# Patient Record
Sex: Male | Born: 1980 | Race: White | Hispanic: No | Marital: Married | Smoking: Never smoker
Health system: Southern US, Community
[De-identification: ages and names within clinical notes are randomized; demographics above are authoritative.]

## PROBLEM LIST (undated history)

## (undated) DIAGNOSIS — Z9889 Other specified postprocedural states: Secondary | ICD-10-CM

## (undated) DIAGNOSIS — I8290 Acute embolism and thrombosis of unspecified vein: Secondary | ICD-10-CM

## (undated) DIAGNOSIS — R112 Nausea with vomiting, unspecified: Secondary | ICD-10-CM

## (undated) HISTORY — PX: OTHER SURGICAL HISTORY: SHX169

---

## 1999-12-06 ENCOUNTER — Encounter: Payer: Self-pay | Admitting: Specialist

## 1999-12-06 ENCOUNTER — Ambulatory Visit (HOSPITAL_COMMUNITY): Admission: RE | Admit: 1999-12-06 | Discharge: 1999-12-06 | Payer: Self-pay | Admitting: Specialist

## 2001-07-23 ENCOUNTER — Emergency Department (HOSPITAL_COMMUNITY): Admission: EM | Admit: 2001-07-23 | Discharge: 2001-07-23 | Payer: Self-pay | Admitting: Emergency Medicine

## 2001-07-23 ENCOUNTER — Encounter: Payer: Self-pay | Admitting: Emergency Medicine

## 2014-03-10 ENCOUNTER — Emergency Department (HOSPITAL_COMMUNITY)
Admission: EM | Admit: 2014-03-10 | Discharge: 2014-03-10 | Disposition: A | Discharge status: Home / Self Care | Payer: Worker's Compensation | Attending: Emergency Medicine | Admitting: Emergency Medicine

## 2014-03-10 ENCOUNTER — Emergency Department (HOSPITAL_COMMUNITY): Payer: Worker's Compensation

## 2014-03-10 ENCOUNTER — Encounter (HOSPITAL_COMMUNITY): Payer: Self-pay | Admitting: Emergency Medicine

## 2014-03-10 DIAGNOSIS — S40011A Contusion of right shoulder, initial encounter: Secondary | ICD-10-CM

## 2014-03-10 DIAGNOSIS — S40019A Contusion of unspecified shoulder, initial encounter: Secondary | ICD-10-CM | POA: Insufficient documentation

## 2014-03-10 DIAGNOSIS — S46909A Unspecified injury of unspecified muscle, fascia and tendon at shoulder and upper arm level, unspecified arm, initial encounter: Secondary | ICD-10-CM | POA: Diagnosis present

## 2014-03-10 DIAGNOSIS — W208XXA Other cause of strike by thrown, projected or falling object, initial encounter: Secondary | ICD-10-CM | POA: Diagnosis not present

## 2014-03-10 DIAGNOSIS — Y990 Civilian activity done for income or pay: Secondary | ICD-10-CM | POA: Diagnosis not present

## 2014-03-10 DIAGNOSIS — Y9389 Activity, other specified: Secondary | ICD-10-CM | POA: Insufficient documentation

## 2014-03-10 DIAGNOSIS — Y9289 Other specified places as the place of occurrence of the external cause: Secondary | ICD-10-CM | POA: Diagnosis not present

## 2014-03-10 DIAGNOSIS — S4980XA Other specified injuries of shoulder and upper arm, unspecified arm, initial encounter: Secondary | ICD-10-CM | POA: Insufficient documentation

## 2014-03-10 MED ORDER — IBUPROFEN 200 MG PO TABS
600.0000 mg | ORAL_TABLET | Freq: Once | ORAL | Status: AC
  Administered 2014-03-10: 600 mg via ORAL
  Filled 2014-03-10: qty 3

## 2014-03-10 MED ORDER — IBUPROFEN 600 MG PO TABS: 600.0000 mg | ORAL_TABLET | Freq: Four times a day (QID) | ORAL | Status: DC | PRN

## 2014-03-10 MED ORDER — OXYCODONE-ACETAMINOPHEN 5-325 MG PO TABS: 1.0000 | ORAL_TABLET | Freq: Four times a day (QID) | ORAL | Status: DC | PRN

## 2014-03-10 NOTE — ED Notes (Signed)
Pt. 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

## 2014-03-10 NOTE — Discharge Instructions (Signed)
Contusion °A contusion is a deep bruise. Contusions are the result of an injury that caused bleeding under the skin. The contusion may turn blue, purple, or yellow. Minor injuries will give you a painless contusion, but more severe contusions may stay painful and swollen for a few weeks.  °CAUSES  °A contusion is usually caused by a blow, trauma, or direct force to an area of the body. °SYMPTOMS  °· Swelling and redness of the injured area. °· Bruising of the injured area. °· Tenderness and soreness of the injured area. °· Pain. °DIAGNOSIS  °The diagnosis can be made by taking a history and physical exam. An X-ray, CT scan, or MRI may be needed to determine if there were any associated injuries, such as fractures. °TREATMENT  °Specific treatment will depend on what area of the body was injured. In general, the best treatment for a contusion is resting, icing, elevating, and applying cold compresses to the injured area. Over-the-counter medicines may also be recommended for pain control. Ask your caregiver what the best treatment is for your contusion. °HOME CARE INSTRUCTIONS  °· Put ice on the injured area. °¨ Put ice in a plastic bag. °¨ Place a towel between your skin and the bag. °¨ Leave the ice on for 15-20 minutes, 3-4 times a day, or as directed by your health care provider. °· Only take over-the-counter or prescription medicines for pain, discomfort, or fever as directed by your caregiver. Your caregiver may recommend avoiding anti-inflammatory medicines (aspirin, ibuprofen, and naproxen) for 48 hours because these medicines may increase bruising. °· Rest the injured area. °· If possible, elevate the injured area to reduce swelling. °SEEK IMMEDIATE MEDICAL CARE IF:  °· You have increased bruising or swelling. °· You have pain that is getting worse. °· Your swelling or pain is not relieved with medicines. °MAKE SURE YOU:  °· Understand these instructions. °· Will watch your condition. °· Will get help right  away if you are not doing well or get worse. °Document Released: 05/08/2005 Document Revised: 08/03/2013 Document Reviewed: 06/03/2011 °ExitCare® Patient Information ©2015 [employer]. This information is not intended to replace advice given to you by your health care provider. Make sure you discuss any questions you have with your health care provider. ° °

## 2014-03-10 NOTE — ED Provider Notes (Signed)
CSN: [id number]     Arrival date & time 03/10/14  0606 History   First MD Initiated Contact with Patient 03/10/14 0700     Chief Complaint  Patient presents with  . Shoulder Injury     (Consider location/radiation/quality/duration/timing/severity/associated sxs/prior Treatment) HPI  This is a 33 year old male with no significant past medical history who presents with right shoulder pain. Patient states that he was at work this morning when a 120 pound gearbox fell onto his right shoulder. He is reporting pain in the right shoulder worsened with movement. He states his pain is 4/10 when he moves his arm. He denies any chest pain or shortness of breath. He denies any other injury. He is not currently on any anticoagulants. He has not taken anything for the pain.  History reviewed. No pertinent past medical history. History reviewed. No pertinent past surgical history. No family history on file. History  Substance Use Topics  . Smoking status: Never Smoker   . Smokeless tobacco: Not on file  . Alcohol Use: No    Review of Systems  Respiratory: Negative for chest tightness and shortness of breath.   Cardiovascular: Negative for chest pain.  Musculoskeletal:       Right arm pain  All other systems reviewed and are negative.     Allergies  Review of patient's allergies indicates no known allergies.  Home Medications   Prior to Admission medications   Medication Sig Start Date End Date Taking? Authorizing Provider  ibuprofen (ADVIL,MOTRIN) 600 MG tablet Take 1 tablet (600 mg total) by mouth every 6 (six) hours as needed. 03/10/14   Shon Baton, MD  oxyCODONE-acetaminophen (PERCOCET/ROXICET) 5-325 MG per tablet Take 1 tablet by mouth every 6 (six) hours as needed for moderate pain or severe pain. 03/10/14   Shon Baton, MD   BP 127/85  Pulse 91  Temp(Src) 98.2 F (36.8 C) (Oral)  Resp 20  SpO2 99% Physical Exam  Nursing note and vitals reviewed. Constitutional:  He is oriented to person, place, and time. He appears well-developed and well-nourished. No distress.  HENT:  Head: Normocephalic and atraumatic.  Cardiovascular: Normal rate, regular rhythm and normal heart sounds.   No murmur heard. Pulmonary/Chest: Effort normal and breath sounds normal. No respiratory distress. He has no wheezes.  Musculoskeletal:  Normal range of motion of the right shoulder, somewhat limited by pain, tenderness to palpation over the posterior deltoid and glenoid, no obvious deformity, no overlying skin changes  Lymphadenopathy:    He has no cervical adenopathy.  Neurological: He is alert and oriented to person, place, and time.  Skin: Skin is warm and dry.  Psychiatric: He has a normal mood and affect.    ED Course  Procedures (including critical care time) Labs Review Labs Reviewed - No data to display  Imaging Review Dg Shoulder Right  03/10/2014   CLINICAL DATA:  Pain after a fall.  EXAM: RIGHT SHOULDER - 2+ VIEW  COMPARISON:  None.  FINDINGS: There is no evidence of fracture or dislocation. There is no evidence of arthropathy or other focal bone abnormality. Soft tissues are unremarkable.  IMPRESSION: Negative.   Electronically Signed   By: Burman Nieves M.D.   On: 03/10/2014 06:49     EKG Interpretation None      MDM   Final diagnoses:  Shoulder contusion, right, initial encounter    Patient presents with right shoulder pain after a heavy box fell on it.  His exam is reassuring. X-ray  negative for acute fracture. Suspect musculoskeletal contusion given exam. Discuss with patient ibuprofen and Percocet for breakthrough pain. He will be given a sling for comfort but was encouraged to range his shoulder often. Patient stated understanding.  After history, exam, and medical workup I feel the patient has been appropriately medically screened and is safe for discharge home. Pertinent diagnoses were discussed with the patient. Patient was given return  precautions.     Shon Batonourtney F Horton, MD 03/10/14 [phone]

## 2014-05-23 ENCOUNTER — Emergency Department
Admission: EM | Admit: 2014-05-23 | Discharge: 2014-05-23 | Disposition: A | Discharge status: Home / Self Care | Payer: BC Managed Care – PPO | Source: Home / Self Care | Attending: Emergency Medicine | Admitting: Emergency Medicine

## 2014-05-23 ENCOUNTER — Encounter: Payer: Self-pay | Admitting: Emergency Medicine

## 2014-05-23 DIAGNOSIS — J069 Acute upper respiratory infection, unspecified: Secondary | ICD-10-CM

## 2014-05-23 DIAGNOSIS — J029 Acute pharyngitis, unspecified: Secondary | ICD-10-CM

## 2014-05-23 HISTORY — DX: Acute embolism and thrombosis of unspecified vein: I82.90

## 2014-05-23 LAB — POCT RAPID STREP A (OFFICE): Rapid Strep A Screen: NEGATIVE

## 2014-05-23 MED ORDER — BENZONATATE 100 MG PO CAPS: 100.0000 mg | ORAL_CAPSULE | Freq: Three times a day (TID) | ORAL | Status: DC

## 2014-05-23 MED ORDER — IBUPROFEN 800 MG PO TABS: 800.0000 mg | ORAL_TABLET | Freq: Three times a day (TID) | ORAL | Status: DC

## 2014-05-23 NOTE — ED Notes (Signed)
Reports starting to feel bad last evening with bilateral ear pain, some nasal congestion, sore throat and body aches; no known fever. No OTCs. Daughter currently has Strep throat. Son just got Flu mist couple days ago. Patient has not gotten flu vaccination yet.

## 2014-05-23 NOTE — Discharge Instructions (Signed)
Upper Respiratory Infection, Adult An upper respiratory infection (URI) is also sometimes known as the common cold. The upper respiratory tract includes the nose, sinuses, throat, trachea, and bronchi. Bronchi are the airways leading to the lungs. Most people improve within 1 week, but symptoms can last up to 2 weeks. A residual cough may last even longer.  CAUSES Many different viruses can infect the tissues lining the upper respiratory tract. The tissues become irritated and inflamed and often become very moist. Mucus production is also common. A cold is contagious. You can easily spread the virus to others by oral contact. This includes kissing, sharing a glass, coughing, or sneezing. Touching your mouth or nose and then touching a surface, which is then touched by another person, can also spread the virus. SYMPTOMS  Symptoms typically develop 1 to 3 days after you come in contact with a cold virus. Symptoms vary from person to person. They may include:  Runny nose.  Sneezing.  Nasal congestion.  Sinus irritation.  Sore throat.  Loss of voice (laryngitis).  Cough.  Fatigue.  Muscle aches.  Loss of appetite.  Headache.  Low-grade fever. DIAGNOSIS  You might diagnose your own cold based on familiar symptoms, since most people get a cold 2 to 3 times a year. Your caregiver can confirm this based on your exam. Most importantly, your caregiver can check that your symptoms are not due to another disease such as strep throat, sinusitis, pneumonia, asthma, or epiglottitis. Blood tests, throat tests, and X-rays are not necessary to diagnose a common cold, but they may sometimes be helpful in excluding other more serious diseases. Your caregiver will decide if any further tests are required. RISKS AND COMPLICATIONS  You may be at risk for a more severe case of the common cold if you smoke cigarettes, have chronic heart disease (such as heart failure) or lung disease (such as asthma), or if  you have a weakened immune system. The very young and very old are also at risk for more serious infections. Bacterial sinusitis, middle ear infections, and bacterial pneumonia can complicate the common cold. The common cold can worsen asthma and chronic obstructive pulmonary disease (COPD). Sometimes, these complications can require emergency medical care and may be life-threatening. PREVENTION  The best way to protect against getting a cold is to practice good hygiene. Avoid oral or hand contact with people with cold symptoms. Wash your hands often if contact occurs. There is no clear evidence that vitamin C, vitamin E, echinacea, or exercise reduces the chance of developing a cold. However, it is always recommended to get plenty of rest and practice good nutrition. TREATMENT  Treatment is directed at relieving symptoms. There is no cure. Antibiotics are not effective, because the infection is caused by a virus, not by bacteria. Treatment may include:  Increased fluid intake. Sports drinks offer valuable electrolytes, sugars, and fluids.  Breathing heated mist or steam (vaporizer or shower).  Eating chicken soup or other clear broths, and maintaining good nutrition.  Getting plenty of rest.  Using gargles or lozenges for comfort.  Controlling fevers with ibuprofen or acetaminophen as directed by your caregiver.  Increasing usage of your inhaler if you have asthma. Zinc gel and zinc lozenges, taken in the first 24 hours of the common cold, can shorten the duration and lessen the severity of symptoms. Pain medicines may help with fever, muscle aches, and throat pain. A variety of non-prescription medicines are available to treat congestion and runny nose. Your caregiver   can make recommendations and may suggest nasal or lung inhalers for other symptoms.  HOME CARE INSTRUCTIONS   Only take over-the-counter or prescription medicines for pain, discomfort, or fever as directed by your  caregiver.  Use a warm mist humidifier or inhale steam from a shower to increase air moisture. This may keep secretions moist and make it easier to breathe.  Drink enough water and fluids to keep your urine clear or pale yellow.  Rest as needed.  Return to work when your temperature has returned to normal or as your caregiver advises. You may need to stay home longer to avoid infecting others. You can also use a face mask and careful hand washing to prevent spread of the virus. SEEK MEDICAL CARE IF:   After the first few days, you feel you are getting worse rather than better.  You need your caregiver's advice about medicines to control symptoms.  You develop chills, worsening shortness of breath, or brown or red sputum. These may be signs of pneumonia.  You develop yellow or brown nasal discharge or pain in the face, especially when you bend forward. These may be signs of sinusitis.  You develop a fever, swollen neck glands, pain with swallowing, or white areas in the back of your throat. These may be signs of strep throat. SEEK IMMEDIATE MEDICAL CARE IF:   You have a fever.  You develop severe or persistent headache, ear pain, sinus pain, or chest pain.  You develop wheezing, a prolonged cough, cough up blood, or have a change in your usual mucus (if you have chronic lung disease).  You develop sore muscles or a stiff neck. Document Released: 01/22/2001 Document Revised: 10/21/2011 Document Reviewed: 11/03/2013 ExitCare Patient Information 2015 [employer]. This information is not intended to replace advice given to you by your health care provider. Make sure you discuss any questions you have with your health care provider.  

## 2014-05-23 NOTE — ED Provider Notes (Signed)
CSN: [id number]     Arrival date & time 05/23/14  1654 History   First MD Initiated Contact with Patient 05/23/14 1746     Chief Complaint  Patient presents with  . Otalgia  . Sore Throat  . Nasal Congestion  . Generalized Body Aches   (Consider location/radiation/quality/duration/timing/severity/associated sxs/prior Treatment) Patient is a 33 y.o. male presenting with ear pain and pharyngitis. The history is provided by the patient. No language interpreter was used.  Otalgia Location:  Bilateral Quality:  Aching Severity:  Moderate Onset quality:  Gradual Duration:  1 day Timing:  Constant Progression:  Worsening Chronicity:  New Relieved by:  Nothing Worsened by:  Nothing tried Associated symptoms: rhinorrhea and sore throat   Associated symptoms: no fever   Risk factors: no chronic ear infection   Sore Throat    Past Medical History  Diagnosis Date  . Clot     right arm   Past Surgical History  Procedure Laterality Date  . Angio       angio jet right sub clavian for clot right arm   History reviewed. No pertinent family history. History  Substance Use Topics  . Smoking status: Never Smoker   . Smokeless tobacco: Not on file  . Alcohol Use: Yes    Review of Systems  Constitutional: Negative for fever.  HENT: Positive for ear pain, rhinorrhea and sore throat.   All other systems reviewed and are negative.   Allergies  Review of patient's allergies indicates no known allergies.  Home Medications   Prior to Admission medications   Medication Sig Start Date End Date Taking? Authorizing Provider  benzonatate (TESSALON) 100 MG capsule Take 1 capsule (100 mg total) by mouth every 8 (eight) hours. 05/23/14   Elson AreasLeslie K Monay Houlton, PA-C  ibuprofen (ADVIL,MOTRIN) 600 MG tablet Take 1 tablet (600 mg total) by mouth every 6 (six) hours as needed. 03/10/14   Shon Batonourtney F Horton, MD  ibuprofen (ADVIL,MOTRIN) 800 MG tablet Take 1 tablet (800 mg total) by mouth 3 (three) times  daily. 05/23/14   Elson AreasLeslie K Ellyssa Zagal, PA-C  oxyCODONE-acetaminophen (PERCOCET/ROXICET) 5-325 MG per tablet Take 1 tablet by mouth every 6 (six) hours as needed for moderate pain or severe pain. 03/10/14   Shon Batonourtney F Horton, MD   BP 114/77  Pulse 95  Temp(Src) 98 F (36.7 C) (Oral)  Resp 16  Ht 5\' 10"  (1.778 m)  Wt 205 lb (92.987 kg)  BMI 29.41 kg/m2  SpO2 98% Physical Exam  Nursing note and vitals reviewed. Constitutional: He is oriented to person, place, and time. He appears well-developed and well-nourished.  HENT:  Head: Normocephalic and atraumatic.  Right Ear: External ear normal.  Nose: Nose normal.  Mouth/Throat: Oropharynx is clear and moist.  Eyes: EOM are normal.  Neck: Normal range of motion.  Cardiovascular: Normal rate and normal heart sounds.   Pulmonary/Chest: Effort normal.  Abdominal: He exhibits no distension.  Musculoskeletal: Normal range of motion.  Neurological: He is alert and oriented to person, place, and time.  Skin: Skin is warm.  Psychiatric: He has a normal mood and affect.    ED Course  Procedures (including critical care time) Labs Review Labs Reviewed  POCT RAPID STREP A (OFFICE)  negative strep  Imaging Review No results found.   MDM   1. Acute upper respiratory infection    Tessalon Ibuprofen  Return if any problems.   AVS    Elson AreasLeslie K Lavaughn Haberle, PA-C 05/23/14 1814

## 2014-05-26 NOTE — ED Provider Notes (Signed)
Medical history/examination/treatment/procedure(s) were performed by non-physician provider and as supervising physician I was immediately available for consultation/collaboration.   David Massey, MD 05/26/14 0827 

## 2019-04-15 ENCOUNTER — Ambulatory Visit (HOSPITAL_COMMUNITY)
Admission: EM | Admit: 2019-04-15 | Discharge: 2019-04-15 | Disposition: A | Discharge status: Home / Self Care | Payer: Worker's Compensation | Attending: Urgent Care | Admitting: Urgent Care

## 2019-04-15 ENCOUNTER — Other Ambulatory Visit: Payer: Self-pay

## 2019-04-15 ENCOUNTER — Encounter (HOSPITAL_COMMUNITY): Payer: Self-pay | Admitting: Emergency Medicine

## 2019-04-15 DIAGNOSIS — M545 Low back pain, unspecified: Secondary | ICD-10-CM

## 2019-04-15 DIAGNOSIS — S39012A Strain of muscle, fascia and tendon of lower back, initial encounter: Secondary | ICD-10-CM | POA: Diagnosis not present

## 2019-04-15 DIAGNOSIS — S161XXA Strain of muscle, fascia and tendon at neck level, initial encounter: Secondary | ICD-10-CM

## 2019-04-15 DIAGNOSIS — M542 Cervicalgia: Secondary | ICD-10-CM

## 2019-04-15 MED ORDER — NAPROXEN 500 MG PO TABS
500.0000 mg | ORAL_TABLET | Freq: Two times a day (BID) | ORAL | 0 refills | Status: DC
Start: 2019-04-15 — End: 2021-03-01

## 2019-04-15 MED ORDER — CYCLOBENZAPRINE HCL 10 MG PO TABS: 10.0000 mg | ORAL_TABLET | Freq: Two times a day (BID) | ORAL | 0 refills | Status: AC | PRN

## 2019-04-15 NOTE — ED Triage Notes (Signed)
Pt here for neck and back pain after slip and fall at work today

## 2019-04-15 NOTE — ED Provider Notes (Signed)
MRN: [id number] DOB: July 06, 1981  Subjective:   Rodney Vasquez is a 38 y.o. male presenting for worker's comp visit.  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  He stepped over an oil spot, lost his footing and started falling backward.  Patient states that he twisted and caught himself before making impact with the ground but has since had worsening constant achy pain over his low back extending all the way up to his neck.  He has difficulty moving around and was unable to sleep well.  He is tried ibuprofen with minimal relief.  Denies weakness, numbness or tingling, hematuria, bruising.  [facility]'s medications list, allergies, past medical history and past surgical history were reviewed and excluded from this note due to being a worker's comp case.   Objective:   Vitals: BP (!) 142/93 (BP Location: Right Arm)   Pulse 90   Temp 98 F (36.7 C) (Oral)   Resp 18   SpO2 97%   Physical Exam Constitutional:      Appearance: Normal appearance. He is well-developed and normal weight.  HENT:     Head: Normocephalic and atraumatic.     Right Ear: External ear normal.     Left Ear: External ear normal.     Nose: Nose normal.     Mouth/Throat:     Pharynx: Oropharynx is clear.  Eyes:     Extraocular Movements: Extraocular movements intact.     Pupils: Pupils are equal, round, and reactive to light.  Cardiovascular:     Rate and Rhythm: Normal rate.  Pulmonary:     Effort: Pulmonary effort is normal.  Musculoskeletal:     Cervical back: He exhibits decreased range of motion (Rotation, extension), tenderness (Over cervical paraspinal muscles and spasms over trapezius, left worse than right) and spasm. He exhibits no bony tenderness, no swelling, no edema and no deformity.     Lumbar back: He exhibits decreased range of motion (Flexion and extension), tenderness and spasm (Over bilateral lumbar region). He exhibits no bony tenderness, no swelling, no edema, no  laceration and no pain.       Back:  Skin:    General: Skin is warm and dry.  Neurological:     Mental Status: He is alert and oriented to person, place, and time.     Deep Tendon Reflexes: Reflexes normal (Patellar and Achilles).  Psychiatric:        Mood and Affect: Mood normal.        Behavior: Behavior normal.      Assessment and Plan :   Strain of lumbar region, initial encounter  Acute strain of neck muscle, initial encounter  Neck pain  Acute bilateral low back pain without sciatica   We will manage conservatively for musculoskeletal type pain due to his cervical/low back strain from his near fall.  Counseled on use of NSAID, muscle relaxant and modification of physical activity.  Work restrictions written for 1 week.  Anticipatory guidance provided.  Counseled patient on potential for adverse effects with medications prescribed/recommended today, ER and return-to-clinic precautions discussed, patient verbalized understanding.   Jaynee Eagles, [place] 04/15/19 1929

## 2019-04-22 ENCOUNTER — Encounter (HOSPITAL_COMMUNITY): Payer: Self-pay

## 2019-04-22 ENCOUNTER — Other Ambulatory Visit: Payer: Self-pay

## 2019-04-22 ENCOUNTER — Ambulatory Visit (HOSPITAL_COMMUNITY)
Admission: EM | Admit: 2019-04-22 | Discharge: 2019-04-22 | Disposition: A | Discharge status: Home / Self Care | Payer: Worker's Compensation

## 2019-04-22 DIAGNOSIS — M546 Pain in thoracic spine: Secondary | ICD-10-CM

## 2019-04-22 DIAGNOSIS — M545 Low back pain, unspecified: Secondary | ICD-10-CM

## 2019-04-22 DIAGNOSIS — Z0289 Encounter for other administrative examinations: Secondary | ICD-10-CM | POA: Diagnosis not present

## 2019-04-22 DIAGNOSIS — Z09 Encounter for follow-up examination after completed treatment for conditions other than malignant neoplasm: Secondary | ICD-10-CM | POA: Diagnosis not present

## 2019-04-22 NOTE — ED Provider Notes (Signed)
[place]    CSN: [id number] Arrival date & time: 04/22/19  1729      History   Chief Complaint Chief Complaint  Patient presents with   Appointment   (5:30 Follow Up)    HPI Rodney Vasquez is a 38 y.o. male no contributing past medical history presenting today for follow-up of back pain.  Patient slipped and fell at work on 9/3.  He was evaluated afterward and treated for MSK pain with anti-inflammatories and muscle relaxers.  He has had continued discomfort throughout the weekend, but in the last 24 to 48 hours has had some improvement in his symptoms.  He was able to get improved rest last night.  Is moving much more freely without as much pain.  He is hoping to return to work.  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  He has been out for approximately 1 week.  He was using Naprosyn and Flexeril.  HPI  Past Medical History:  Diagnosis Date   Clot    right arm    There are no active problems to display for this patient.   Past Surgical History:  Procedure Laterality Date   angio      angio jet right sub clavian for clot right arm       Home Medications    Prior to Admission medications   Medication Sig Start Date End Date Taking? Authorizing Provider  cyclobenzaprine (FLEXERIL) 10 MG tablet Take 1 tablet (10 mg total) by mouth 2 (two) times daily as needed for muscle spasms. 04/15/19   Jaynee Eagles, PA-C  naproxen (NAPROSYN) 500 MG tablet Take 1 tablet (500 mg total) by mouth 2 (two) times daily. 04/15/19   Jaynee Eagles, PA-C    Family History Family History  Family history unknown: Yes    Social History Social History   Tobacco Use   Smoking status: Never Smoker  Substance Use Topics   Alcohol use: Yes   Drug use: No     Allergies   Patient has no known allergies.   Review of Systems Review of Systems  Constitutional: Negative for fatigue and fever.  Eyes: Negative for redness, itching and visual  disturbance.  Respiratory: Negative for shortness of breath.   Cardiovascular: Negative for chest pain and leg swelling.  Gastrointestinal: Negative for nausea and vomiting.  Genitourinary: Negative for decreased urine volume and difficulty urinating.  Musculoskeletal: Positive for back pain and myalgias. Negative for arthralgias.  Skin: Negative for color change, rash and wound.  Neurological: Negative for dizziness, syncope, weakness, light-headedness and headaches.     Physical Exam Triage Vital Signs ED Triage Vitals  Enc Vitals Group     BP 04/22/19 1751 130/83     Pulse Rate 04/22/19 1751 78     Resp 04/22/19 1751 16     Temp 04/22/19 1751 98.1 F (36.7 C)     Temp Source 04/22/19 1751 Temporal     SpO2 04/22/19 1751 100 %     Weight --      Height --      Head Circumference --      Peak Flow --      Pain Score 04/22/19 1754 2     Pain Loc --      Pain Edu? --      Excl. in [place]? --    No data found.  Updated Vital Signs BP 130/83 (BP Location: Left Arm)  Pulse 78    Temp 98.1 F (36.7 C) (Temporal)    Resp 16    SpO2 100%   Visual Acuity Right Eye Distance:   Left Eye Distance:   Bilateral Distance:    Right Eye Near:   Left Eye Near:    Bilateral Near:     Physical Exam Vitals signs and nursing note reviewed.  Constitutional:      Appearance: He is well-developed.     Comments: No acute distress  HENT:     Head: Normocephalic and atraumatic.     Nose: Nose normal.  Eyes:     Conjunctiva/sclera: Conjunctivae normal.  Neck:     Musculoskeletal: Neck supple.  Cardiovascular:     Rate and Rhythm: Normal rate.  Pulmonary:     Effort: Pulmonary effort is normal. No respiratory distress.  Abdominal:     General: There is no distension.  Musculoskeletal: Normal range of motion.     Comments: Nontender to palpation of cervical, thoracic and lumbar spine midline, nonreproducible tenderness throughout bilateral lumbar thoracic and cervical  musculature Full active range of motion of neck Full active range of motion of shoulders, strength 5/5 and equal bilaterally at shoulders, grip strength 5/5 normal bilaterally Hip strength 5/5 and equal bilaterally, knee strength 5/5 ankle bilaterally, patellar reflex 2+ bilaterally Gait without abnormality  Skin:    General: Skin is warm and dry.  Neurological:     Mental Status: He is alert and oriented to person, place, and time.      UC Treatments / Results  Labs (all labs ordered are listed, but only abnormal results are displayed) Labs Reviewed - No data to display  EKG   Radiology No results found.  Procedures Procedures (including critical care time)  Medications Ordered in UC Medications - No data to display  Initial Impression / Assessment and Plan / UC Course  I have reviewed the triage vital signs and the nursing notes.  Pertinent labs & imaging results that were available during my care of the patient were reviewed by me and considered in my medical decision making (see chart for details).     Exam unremarkable, patient 1 week out from initial injury.  Feeling improved.  Strength intact.  No neuro deficits.  Will allow to return to work, did discuss gradually increasing lifting load.  Continue anti-inflammatories/muscle relaxers as prescribed.Discussed strict return precautions. Patient verbalized understanding and is agreeable with plan.  Final Clinical Impressions(s) / UC Diagnoses   Final diagnoses:  Acute bilateral low back pain without sciatica  Acute bilateral thoracic back pain  Follow up   Discharge Instructions   None    ED Prescriptions    None     Controlled Substance Prescriptions [place] Controlled Substance Registry consulted? Not Applicable   Lew DawesWieters, Breckin Zafar C, [place] JerseyPA-C 04/22/19 1820
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
## 2019-05-06 ENCOUNTER — Other Ambulatory Visit: Payer: Self-pay

## 2019-05-06 DIAGNOSIS — Z20822 Contact with and (suspected) exposure to covid-19: Secondary | ICD-10-CM

## 2019-05-07 LAB — NOVEL CORONAVIRUS, NAA: SARS-CoV-2, NAA: NOT DETECTED

## 2020-08-23 ENCOUNTER — Other Ambulatory Visit: Payer: BC Managed Care – PPO

## 2020-08-23 DIAGNOSIS — Z20822 Contact with and (suspected) exposure to covid-19: Secondary | ICD-10-CM

## 2020-08-25 LAB — NOVEL CORONAVIRUS, NAA: SARS-CoV-2, NAA: NOT DETECTED

## 2020-08-25 LAB — SARS-COV-2, NAA 2 DAY TAT

## 2021-01-19 ENCOUNTER — Inpatient Hospital Stay (HOSPITAL_COMMUNITY)
Admission: EM | Admit: 2021-01-19 | Discharge: 2021-01-25 | DRG: 201 | Disposition: A | Discharge status: Home / Self Care | Payer: BC Managed Care – PPO | Attending: Surgery | Admitting: Surgery

## 2021-01-19 DIAGNOSIS — Z4682 Encounter for fitting and adjustment of non-vascular catheter: Secondary | ICD-10-CM

## 2021-01-19 DIAGNOSIS — S80212A Abrasion, left knee, initial encounter: Secondary | ICD-10-CM | POA: Diagnosis present

## 2021-01-19 DIAGNOSIS — Y92410 Unspecified street and highway as the place of occurrence of the external cause: Secondary | ICD-10-CM

## 2021-01-19 DIAGNOSIS — Z20822 Contact with and (suspected) exposure to covid-19: Secondary | ICD-10-CM | POA: Diagnosis present

## 2021-01-19 DIAGNOSIS — J939 Pneumothorax, unspecified: Principal | ICD-10-CM | POA: Diagnosis present

## 2021-01-19 DIAGNOSIS — J9690 Respiratory failure, unspecified, unspecified whether with hypoxia or hypercapnia: Secondary | ICD-10-CM

## 2021-01-19 DIAGNOSIS — M25511 Pain in right shoulder: Secondary | ICD-10-CM

## 2021-01-19 DIAGNOSIS — Z9689 Presence of other specified functional implants: Secondary | ICD-10-CM

## 2021-01-19 MED ORDER — ONDANSETRON HCL 4 MG/2ML IJ SOLN
4.0000 mg | Freq: Once | INTRAMUSCULAR | Status: AC
  Administered 2021-01-20: 4 mg via INTRAVENOUS
  Filled 2021-01-19: qty 2

## 2021-01-19 MED ORDER — MORPHINE SULFATE (PF) 4 MG/ML IV SOLN
4.0000 mg | Freq: Once | INTRAVENOUS | Status: AC
  Administered 2021-01-20: 4 mg via INTRAVENOUS
  Filled 2021-01-19: qty 1
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
## 2021-01-20 ENCOUNTER — Emergency Department (HOSPITAL_COMMUNITY): Payer: BC Managed Care – PPO

## 2021-01-20 ENCOUNTER — Encounter (HOSPITAL_COMMUNITY): Payer: Self-pay

## 2021-01-20 ENCOUNTER — Observation Stay (HOSPITAL_COMMUNITY): Payer: BC Managed Care – PPO

## 2021-01-20 ENCOUNTER — Emergency Department (HOSPITAL_COMMUNITY): Payer: Self-pay

## 2021-01-20 DIAGNOSIS — J939 Pneumothorax, unspecified: Secondary | ICD-10-CM | POA: Diagnosis not present

## 2021-01-20 DIAGNOSIS — S80212A Abrasion, left knee, initial encounter: Secondary | ICD-10-CM | POA: Diagnosis present

## 2021-01-20 DIAGNOSIS — Y92410 Unspecified street and highway as the place of occurrence of the external cause: Secondary | ICD-10-CM | POA: Diagnosis not present

## 2021-01-20 DIAGNOSIS — M25511 Pain in right shoulder: Secondary | ICD-10-CM | POA: Diagnosis present

## 2021-01-20 DIAGNOSIS — R079 Chest pain, unspecified: Secondary | ICD-10-CM | POA: Diagnosis not present

## 2021-01-20 DIAGNOSIS — Z20822 Contact with and (suspected) exposure to covid-19: Secondary | ICD-10-CM | POA: Diagnosis present

## 2021-01-20 LAB — CBC WITH DIFFERENTIAL/PLATELET
Abs Immature Granulocytes: 0.06 10*3/uL (ref 0.00–0.07)
Basophils Absolute: 0 10*3/uL (ref 0.0–0.1)
Basophils Relative: 0 %
Eosinophils Absolute: 0.2 10*3/uL (ref 0.0–0.5)
Eosinophils Relative: 2 %
HCT: 42.1 % (ref 39.0–52.0)
Hemoglobin: 13.8 g/dL (ref 13.0–17.0)
Immature Granulocytes: 1 %
Lymphocytes Relative: 30 %
Lymphs Abs: 3.3 10*3/uL (ref 0.7–4.0)
MCH: 29.7 pg (ref 26.0–34.0)
MCHC: 32.8 g/dL (ref 30.0–36.0)
MCV: 90.5 fL (ref 80.0–100.0)
Monocytes Absolute: 0.8 10*3/uL (ref 0.1–1.0)
Monocytes Relative: 7 %
Neutro Abs: 6.6 10*3/uL (ref 1.7–7.7)
Neutrophils Relative %: 60 %
Platelets: 282 10*3/uL (ref 150–400)
RBC: 4.65 MIL/uL (ref 4.22–5.81)
RDW: 13.3 % (ref 11.5–15.5)
WBC: 10.9 10*3/uL — ABNORMAL HIGH (ref 4.0–10.5)
nRBC: 0 % (ref 0.0–0.2)

## 2021-01-20 LAB — BASIC METABOLIC PANEL
Anion gap: 9 (ref 5–15)
BUN: 11 mg/dL (ref 6–20)
CO2: 26 mmol/L (ref 22–32)
Calcium: 8.7 mg/dL — ABNORMAL LOW (ref 8.9–10.3)
Chloride: 104 mmol/L (ref 98–111)
Creatinine, Ser: 1.1 mg/dL (ref 0.61–1.24)
GFR, Estimated: >60 mL/min (ref >60)
Glucose, Bld: 174 mg/dL — ABNORMAL HIGH (ref 70–99)
Potassium: 3.7 mmol/L (ref 3.5–5.1)
Sodium: 139 mmol/L (ref 135–145)

## 2021-01-20 LAB — ETHANOL: Alcohol, Ethyl (B): 119 mg/dL — ABNORMAL HIGH (ref <10)

## 2021-01-20 LAB — TYPE AND SCREEN
ABO/RH(D): AB POS
Antibody Screen: NEGATIVE

## 2021-01-20 LAB — SARS CORONAVIRUS 2 (TAT 6-24 HRS): SARS Coronavirus 2: NEGATIVE

## 2021-01-20 LAB — ABO/RH: ABO/RH(D): AB POS

## 2021-01-20 LAB — LACTIC ACID, PLASMA: Lactic Acid, Venous: 1.9 mmol/L (ref 0.5–1.9)

## 2021-01-20 MED ORDER — MORPHINE SULFATE (PF) 2 MG/ML IV SOLN
2.0000 mg | Freq: Once | INTRAVENOUS | Status: DC
Start: 2021-01-20 — End: 2021-01-22
  Filled 2021-01-20: qty 1

## 2021-01-20 MED ORDER — ACETAMINOPHEN 500 MG PO TABS
1000.0000 mg | ORAL_TABLET | Freq: Four times a day (QID) | ORAL | Status: DC
  Administered 2021-01-20 – 2021-01-25 (×19): 1000 mg via ORAL
  Filled 2021-01-20 (×21): qty 2

## 2021-01-20 MED ORDER — ENOXAPARIN SODIUM 30 MG/0.3ML IJ SOSY
30.0000 mg | PREFILLED_SYRINGE | Freq: Two times a day (BID) | INTRAMUSCULAR | Status: DC
  Administered 2021-01-21 – 2021-01-24 (×7): 30 mg via SUBCUTANEOUS
  Filled 2021-01-20 (×9): qty 0.3

## 2021-01-20 MED ORDER — ONDANSETRON HCL 4 MG/2ML IJ SOLN
4.0000 mg | Freq: Four times a day (QID) | INTRAMUSCULAR | Status: DC | PRN
  Administered 2021-01-20 (×2): 4 mg via INTRAVENOUS
  Filled 2021-01-20: qty 2

## 2021-01-20 MED ORDER — SODIUM CHLORIDE 0.9 % IV BOLUS
1000.0000 mL | Freq: Once | INTRAVENOUS | Status: AC
  Administered 2021-01-20: 1000 mL via INTRAVENOUS

## 2021-01-20 MED ORDER — ONDANSETRON HCL 4 MG/2ML IJ SOLN
4.0000 mg | Freq: Once | INTRAMUSCULAR | Status: DC
  Filled 2021-01-20: qty 2

## 2021-01-20 MED ORDER — KETOROLAC TROMETHAMINE 15 MG/ML IJ SOLN
15.0000 mg | Freq: Three times a day (TID) | INTRAMUSCULAR | Status: DC | PRN
  Administered 2021-01-20 – 2021-01-21 (×3): 15 mg via INTRAVENOUS
  Filled 2021-01-20 (×3): qty 1

## 2021-01-20 MED ORDER — OXYCODONE HCL 5 MG PO TABS
10.0000 mg | ORAL_TABLET | ORAL | Status: DC | PRN
Start: 2021-01-20 — End: 2021-01-25
  Administered 2021-01-20 – 2021-01-21 (×4): 10 mg via ORAL
  Filled 2021-01-20 (×5): qty 2

## 2021-01-20 MED ORDER — METHOCARBAMOL 500 MG PO TABS
500.0000 mg | ORAL_TABLET | Freq: Four times a day (QID) | ORAL | Status: DC | PRN
  Administered 2021-01-20 – 2021-01-22 (×7): 500 mg via ORAL
  Filled 2021-01-20 (×7): qty 1

## 2021-01-20 MED ORDER — IOHEXOL 300 MG/ML  SOLN
100.0000 mL | Freq: Once | INTRAMUSCULAR | Status: AC | PRN
  Administered 2021-01-20: 100 mL via INTRAVENOUS

## 2021-01-20 MED ORDER — MORPHINE SULFATE (PF) 2 MG/ML IV SOLN
2.0000 mg | INTRAVENOUS | Status: DC | PRN
Start: 2021-01-20 — End: 2021-01-22
  Administered 2021-01-20 (×5): 2 mg via INTRAVENOUS
  Filled 2021-01-20 (×4): qty 1

## 2021-01-20 MED ORDER — MORPHINE SULFATE (PF) 4 MG/ML IV SOLN
4.0000 mg | Freq: Once | INTRAVENOUS | Status: AC
Start: 2021-01-20 — End: 2021-01-20
  Administered 2021-01-20: 4 mg via INTRAVENOUS
  Filled 2021-01-20: qty 1

## 2021-01-20 MED ORDER — OXYCODONE HCL 5 MG PO TABS
5.0000 mg | ORAL_TABLET | ORAL | Status: DC | PRN
  Administered 2021-01-20: 5 mg via ORAL
  Filled 2021-01-20: qty 1

## 2021-01-20 MED ORDER — ONDANSETRON 4 MG PO TBDP: 4.0000 mg | ORAL_TABLET | Freq: Four times a day (QID) | ORAL | Status: DC | PRN

## 2021-01-20 MED ORDER — SODIUM CHLORIDE 0.9 % IV SOLN: INTRAVENOUS | Status: DC
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
## 2021-01-20 NOTE — Procedures (Signed)
Chest tube insertion  Date/Time: 01/20/2021 9:42 AM Performed by: Axel Filler, MD Authorized by: Axel Filler, MD   Consent:    Consent obtained:  Written   Consent given by:  Patient   Alternatives discussed:  No treatment Pre-procedure details:    Skin preparation:  ChloraPrep Anesthesia (see MAR for exact dosages):    Anesthesia method:  Local infiltration   Local anesthetic:  Bupivacaine 0.25% WITH epi Procedure details:    Placement location:  R anterior   Scalpel size:  11   Tube size (Fr):  8   Technique: Seldinger     Ultrasound guidance: no     Tension pneumothorax: no     Tube connected to:  Suction   Drainage characteristics:  Air only   Suture material:  2-0 silk   Dressing:  4x4 sterile gauze Post-procedure details:    Post-insertion x-ray findings comment:  CXR pending

## 2021-01-20 NOTE — ED Notes (Signed)
Transferred to 4N bed 6 via EDT by gurney, father in law with pt

## 2021-01-20 NOTE — ED Notes (Signed)
Zero output from chest tube
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
## 2021-01-20 NOTE — ED Notes (Signed)
Dr Derrell Lolling in with pt placing chest tube. [place], trauma RN also in with pt to assist Dr Derrell Lolling

## 2021-01-20 NOTE — ED Notes (Signed)
Bedside cxr being done via Xray tech for s/p chest tube placement,

## 2021-01-20 NOTE — ED Notes (Signed)
Patient transported to CT 
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
## 2021-01-20 NOTE — ED Notes (Signed)
Zero output from chest tube 
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
## 2021-01-20 NOTE — ED Notes (Signed)
I tried to call report to [facility] and spoke with the secretary, Lupita Leash.  She told me that the charge nurse requested time and that someone would call me back.Marland Kitchen

## 2021-01-21 ENCOUNTER — Inpatient Hospital Stay (HOSPITAL_COMMUNITY): Payer: BC Managed Care – PPO

## 2021-01-21 MED ORDER — POLYETHYLENE GLYCOL 3350 17 G PO PACK
17.0000 g | PACK | Freq: Every day | ORAL | Status: DC
  Administered 2021-01-22: 17 g via ORAL
  Filled 2021-01-21 (×3): qty 1

## 2021-01-21 MED ORDER — DOCUSATE SODIUM 100 MG PO CAPS
100.0000 mg | ORAL_CAPSULE | Freq: Two times a day (BID) | ORAL | Status: DC
  Administered 2021-01-21 – 2021-01-22 (×4): 100 mg via ORAL
  Filled 2021-01-21 (×6): qty 1

## 2021-01-21 MED ORDER — DIPHENHYDRAMINE HCL 25 MG PO CAPS
25.0000 mg | ORAL_CAPSULE | Freq: Four times a day (QID) | ORAL | Status: DC | PRN
  Administered 2021-01-21: 25 mg via ORAL
  Filled 2021-01-21: qty 1
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
## 2021-01-22 ENCOUNTER — Inpatient Hospital Stay (HOSPITAL_COMMUNITY): Payer: BC Managed Care – PPO

## 2021-01-22 ENCOUNTER — Other Ambulatory Visit: Payer: Self-pay

## 2021-01-22 ENCOUNTER — Encounter (HOSPITAL_COMMUNITY): Payer: Self-pay

## 2021-01-22 MED ORDER — MORPHINE SULFATE (PF) 2 MG/ML IV SOLN: 1.0000 mg | Freq: Four times a day (QID) | INTRAVENOUS | Status: DC | PRN

## 2021-01-22 MED ORDER — KETOROLAC TROMETHAMINE 15 MG/ML IJ SOLN
30.0000 mg | Freq: Four times a day (QID) | INTRAMUSCULAR | Status: DC
  Administered 2021-01-22 – 2021-01-25 (×13): 30 mg via INTRAVENOUS
  Filled 2021-01-22 (×13): qty 2

## 2021-01-22 NOTE — Progress Notes (Signed)
Patient transferred to 6N14. Patient transported to 6N14 on wheelchair. Patient alert and oriented x4. Vital signs within normal.
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
## 2021-01-22 NOTE — Discharge Instructions (Signed)
PNEUMOTHORAX  HOME INSTRUCTIONS   PAIN CONTROL:  Pain is best controlled by a usual combination of three different methods TOGETHER:  Ice/Heat Over the counter pain medication Prescription pain medication You may experience some swelling and bruising in area of broken ribs. Ice packs or heating pads (30-60 minutes up to 6 times a day) will help. Use ice for the first few days to help decrease swelling and bruising, then switch to heat to help relax tight/sore spots and speed recovery. Some people prefer to use ice alone, heat alone, alternating between ice & heat. Experiment to what works for you. Swelling and bruising can take several weeks to resolve.  It is helpful to take an over-the-counter pain medication regularly for the first few weeks. Choose one of the following that works best for you:  Naproxen (Aleve, etc) Two 220mg  tabs twice a day Ibuprofen (Advil, etc) Three 200mg  tabs four times a day (every meal & bedtime) Acetaminophen (Tylenol, etc) 500-650mg  four times a day (every meal & bedtime) A prescription for pain medication (such as oxycodone, hydrocodone, etc) may be given to you upon discharge. Take your pain medication as prescribed.  If you are having problems/concerns with the prescription medicine (does not control pain, nausea, vomiting, rash, itching, etc), please call [phone] to see if we need to switch you to a different pain medicine that will work better for you and/or control your side effect better. If you need a refill on your pain medication, please contact your pharmacy. They will contact our office to request authorization. Prescriptions will not be filled after 5 pm or on week-ends. Avoid getting constipated. When taking pain medications, it is common to experience some constipation. Increasing fluid intake and taking a fiber supplement (such as Metamucil, Citrucel, FiberCon, MiraLax, etc) 1-2 times a day regularly will usually help prevent this problem from  occurring. A mild laxative (prune juice, Milk of Magnesia, MiraLax, etc) should be taken according to package directions if there are no bowel movements after 48 hours.  Watch out for diarrhea. If you have many loose bowel movements, simplify your diet to bland foods & liquids for a few days. Stop any stool softeners and decrease your fiber supplement. Switching to mild anti-diarrheal medications (Kayopectate, Pepto Bismol) can help. If this worsens or does not improve, please call us. Chest tube site wound: you may remove the dressing from your chest tube site 3 days after the removal of your chest tube. DO NOT shower over the dressing. Once   removed, you may shower as normal. Do not submerge your wound in water for 2-3 weeks.  FOLLOW UP  Please call our office to set up or confirm an appointment for follow up for 2 weeks after discharge. You will need to get a chest xray at either [facility] or [facility]. This will be outlined in your follow up instructions. Please call [facility] at [phone] if you have any questions about follow up.  If you have any orthopedic or other injuries you will need to follow up as outlined in your follow up instructions.   WHEN TO CALL [facility] [phone]:  Poor pain control Reactions / problems with new medications (rash/itching, nausea, etc)  Fever over 101.5 F (38.5 C) Worsening swelling or bruising Redness, drainage, pain or swelling around chest tube site Worsening pain, productive cough, difficulty breathing or any other concerning symptoms  The clinic staff is available to answer your questions during regular business hours (8:30am-5pm). Please  don't hesitate to call and ask to speak to one of our nurses for clinical concerns.  If you have a medical emergency, go to the nearest emergency room or call 911.  A surgeon from [facility] is always on call at the [facility], [place]  [address],  [place], [place] [address] ?  MAIN: [phone] ? TOLL FREE: [phone] ?  FAX [phone]  [email or web address]     Pneumothorax A pneumothorax is commonly called a collapsed lung. It is a condition in which air leaks from a lung and builds up between the thin layer of tissue that covers the lungs (visceral pleura) and the interior wall of the chest cavity (parietal pleura). The air gets trapped outside the lung, between the lung and the chest wall (pleural space). The air takes up space and prevents the lung from fully expanding. This condition sometimes occurs suddenly with no apparent cause. The buildup of air may be small or large. A small pneumothorax may go away on its own. A large pneumothorax will require treatment and hospitalization. What are the causes? This condition may be caused by: Trauma and injury to the chest wall. Surgery and other medical procedures. A complication of an underlying lung problem, especially chronic obstructive pulmonary disease (COPD) or emphysema. Sometimes the cause of this condition is not known. What increases the risk? You are more likely to develop this condition if: You have an underlying lung problem. You smoke. You are 14-59 years old, male, tall, and underweight. You have a personal or family history of pneumothorax. You have an eating disorder (anorexia nervosa). This condition can also happen quickly, even in people with no history of lung problems. What are the signs or symptoms? Sometimes a pneumothorax will have no symptoms. When symptoms are present, they can include: Chest pain. Shortness of breath. Increased rate of breathing. Bluish color to your lips or skin (cyanosis). How is this diagnosed? This condition may be diagnosed by: A medical history and physical exam. A chest X-ray, chest CT scan, or ultrasound. How is this treated? Treatment depends on how severe your condition is. The goal of treatment is to  remove the extra air and allow your lung to expand back to its normal size. For a small pneumothorax: No treatment may be needed. Extra oxygen is sometimes used to make it go away more quickly. For a large pneumothorax or a pneumothorax that is causing symptoms, a procedure is done to drain the air from your lungs. To do this, a health care provider may use: A needle with a syringe. This is used to suck air from a pleural space where no additional leakage is taking place. A chest tube. This is used to suck air where there is ongoing leakage into the pleural space. The chest tube may need to remain in place for several days until the air leak has healed. In more severe cases, surgery may be needed to repair the damage that is causing the leak. If you have multiple pneumothorax episodes or have an air leak that will not heal, a procedure called a pleurodesis may be done. A medicine is placed in the pleural space to irritate the tissues around the lung so that the lung will stick to the chest wall, seal any leaks, and stop any buildup of air in that space. If you have an underlying lung problem, severe symptoms, or a large pneumothorax you will usually need to stay in  the hospital. Follow these instructions at home: Lifestyle Do not use any products that contain nicotine or tobacco, such as cigarettes and e-cigarettes. These are major risk factors in pneumothorax. If you need help quitting, ask your health care provider. Do not lift anything that is heavier than 10 lb (4.5 kg), or the limit that your health care provider tells you, until he or she says that it is safe. Avoid activities that take a lot of effort (strenuous) for as long as told by your health care provider. Return to your normal activities as told by your health care provider. Ask your health care provider what activities are safe for you. Do not fly in an airplane or scuba dive until your health care provider says it is okay. General  instructions Take over-the-counter and prescription medicines only as told by your health care provider. If a cough or pain makes it difficult for you to sleep at night, try sleeping in a semi-upright position in a recliner or by using 2 or 3 pillows. If you had a chest tube and it was removed, ask your health care provider when you can remove the bandage (dressing). While the dressing is in place, do not allow it to get wet. Keep all follow-up visits as told by your health care provider. This is important. Contact a health care provider if: You cough up thick mucus (sputum) that is yellow or green in color. You were treated with a chest tube, and you have redness, increasing pain, or discharge at the site where it was placed. Get help right away if: You have increasing chest pain or shortness of breath. You have a cough that will not go away. You begin coughing up blood. You have pain that is getting worse or is not controlled with medicines. The site where your chest tube was located opens up. You feel air coming out of the site where the chest tube was placed. You have a fever or persistent symptoms for more than 2-3 days. You have a fever and your symptoms suddenly get worse. These symptoms may represent a serious problem that is an emergency. Do not wait to see if the symptoms will go away. Get medical help right away. Call your local emergency services (911 in the [place] Do not drive yourself to the hospital. Summary A pneumothorax, commonly called a collapsed lung, is a condition in which air leaks from a lung and gets trapped between the lung and the chest wall (pleural space). The buildup of air may be small or large. A small pneumothorax may go away on its own. A large pneumothorax will require treatment and hospitalization. Treatment for this condition depends on how severe the pneumothorax is. The goal of treatment is to remove the extra air and allow the lung to expand back to its  normal size. This information is not intended to replace advice given to you by your health care provider. Make sure you discuss any questions you have with your health care provider. Document Released: 07/29/2005 Document Revised: 07/07/2017 Document Reviewed: 07/07/2017 Elsevier Interactive Patient Education  2019 [employer].
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
## 2021-01-22 NOTE — Plan of Care (Signed)
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
## 2021-01-23 ENCOUNTER — Inpatient Hospital Stay (HOSPITAL_COMMUNITY): Payer: BC Managed Care – PPO

## 2021-01-23 MED ORDER — MORPHINE SULFATE (PF) 2 MG/ML IV SOLN
1.0000 mg | Freq: Four times a day (QID) | INTRAVENOUS | Status: DC | PRN
Start: 2021-01-23 — End: 2021-01-25

## 2021-01-23 MED ORDER — GUAIFENESIN ER 600 MG PO TB12
600.0000 mg | ORAL_TABLET | Freq: Two times a day (BID) | ORAL | Status: DC | PRN
  Administered 2021-01-23: 600 mg via ORAL
  Filled 2021-01-23: qty 1
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
## 2021-01-23 NOTE — Progress Notes (Signed)
PT Cancellation/Discharge Note  Patient Details Name: Rodney Vasquez MRN: [id number] DOB: Jan 12, 1981   Cancelled Treatment:    Reason Eval/Treat Not Completed: Other (comment).  Pt with significant improvement since epley's last night, no sensation of dizziness or spinning with bed mobility or gait today.  Just waiting for medical stability.  He has been walking with RN staff and does not need the skilled supervision of a PT.  PT to sign off.  Please re-order [place] if new symptoms arise.   Thanks,  Rodney Vasquez, PT, DPT  Acute Rehabilitation Ortho Tech Supervisor [phone] pager #[phone] office      Rodney Vasquez 01/23/2021, 11:54 AM

## 2021-01-23 NOTE — Plan of Care (Signed)

## 2021-01-24 ENCOUNTER — Inpatient Hospital Stay (HOSPITAL_COMMUNITY): Payer: BC Managed Care – PPO

## 2021-01-24 MED ORDER — LORATADINE 10 MG PO TABS
10.0000 mg | ORAL_TABLET | Freq: Every day | ORAL | Status: DC
  Administered 2021-01-24 – 2021-01-25 (×2): 10 mg via ORAL
  Filled 2021-01-24 (×2): qty 1
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
## 2021-01-25 ENCOUNTER — Inpatient Hospital Stay (HOSPITAL_COMMUNITY): Payer: BC Managed Care – PPO

## 2021-01-25 MED ORDER — METHOCARBAMOL 500 MG PO TABS: 500.0000 mg | ORAL_TABLET | Freq: Four times a day (QID) | ORAL | 0 refills | Status: AC | PRN

## 2021-01-25 MED ORDER — OXYCODONE HCL 5 MG PO TABS: 5.0000 mg | ORAL_TABLET | Freq: Four times a day (QID) | ORAL | 0 refills | Status: DC | PRN

## 2021-01-25 MED ORDER — POLYETHYLENE GLYCOL 3350 17 G PO PACK: 17.0000 g | PACK | Freq: Every day | ORAL | 0 refills | Status: DC

## 2021-01-25 MED ORDER — ACETAMINOPHEN 500 MG PO TABS: 1000.0000 mg | ORAL_TABLET | Freq: Three times a day (TID) | ORAL | 0 refills | Status: DC | PRN

## 2021-01-25 MED ORDER — DOCUSATE SODIUM 100 MG PO CAPS: 100.0000 mg | ORAL_CAPSULE | Freq: Two times a day (BID) | ORAL | 0 refills | Status: DC | PRN

## 2021-01-25 NOTE — TOC Transition Note (Signed)
Transition of Care [facility]) - CM/SW Discharge Note   Patient Details  Name: Rodney Vasquez MRN: [id number] Date of Birth: 03-15-1981  Transition of Care [facility]) CM/SW Contact:  Glennon Mac, RN Phone Number: 01/25/2021, 3:23 PM   Clinical Narrative:   Patient medically stable for discharge home with wife and other family members to assist.  PT/OT recommending no outpatient follow-up at this time.  No discharge needs identified.    Final next level of care: Home/Self Care Barriers to Discharge: Barriers Resolved   Patient Goals and CMS Choice Patient states their goals for this hospitalization and ongoing recovery are:: to go home                            Discharge Plan and Services   Discharge Planning Services: CM Consult                                 Social Determinants of Health (SDOH) Interventions     Readmission Risk Interventions No flowsheet data found.  Quintella Baton, RN, BSN  Trauma/Neuro ICU Case Manager [phone]
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
## 2021-01-31 ENCOUNTER — Other Ambulatory Visit: Payer: Self-pay | Admitting: General Surgery

## 2021-01-31 ENCOUNTER — Ambulatory Visit
Admission: RE | Admit: 2021-01-31 | Discharge: 2021-01-31 | Disposition: A | Discharge status: Home / Self Care | Payer: BC Managed Care – PPO | Source: Ambulatory Visit | Attending: General Surgery | Admitting: General Surgery

## 2021-01-31 ENCOUNTER — Other Ambulatory Visit: Payer: Self-pay

## 2021-01-31 DIAGNOSIS — J939 Pneumothorax, unspecified: Secondary | ICD-10-CM

## 2021-02-08 ENCOUNTER — Other Ambulatory Visit: Payer: Self-pay | Admitting: Physician Assistant

## 2021-02-08 DIAGNOSIS — M25511 Pain in right shoulder: Secondary | ICD-10-CM

## 2021-02-22 ENCOUNTER — Encounter (HOSPITAL_BASED_OUTPATIENT_CLINIC_OR_DEPARTMENT_OTHER): Payer: Self-pay | Admitting: Orthopaedic Surgery

## 2021-02-22 ENCOUNTER — Other Ambulatory Visit: Payer: Self-pay

## 2021-02-28 NOTE — Progress Notes (Signed)
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
## 2021-03-01 ENCOUNTER — Encounter (HOSPITAL_BASED_OUTPATIENT_CLINIC_OR_DEPARTMENT_OTHER)
Admission: RE | Disposition: A | Discharge status: Home / Self Care | Payer: Self-pay | Source: Home / Self Care | Attending: Orthopaedic Surgery

## 2021-03-01 ENCOUNTER — Encounter (HOSPITAL_BASED_OUTPATIENT_CLINIC_OR_DEPARTMENT_OTHER): Payer: Self-pay | Admitting: Orthopaedic Surgery

## 2021-03-01 ENCOUNTER — Ambulatory Visit (HOSPITAL_BASED_OUTPATIENT_CLINIC_OR_DEPARTMENT_OTHER)
Admission: RE | Admit: 2021-03-01 | Discharge: 2021-03-01 | Disposition: A | Discharge status: Home / Self Care | Payer: BC Managed Care – PPO | Attending: Orthopaedic Surgery | Admitting: Orthopaedic Surgery

## 2021-03-01 ENCOUNTER — Other Ambulatory Visit: Payer: Self-pay

## 2021-03-01 ENCOUNTER — Ambulatory Visit (HOSPITAL_BASED_OUTPATIENT_CLINIC_OR_DEPARTMENT_OTHER): Payer: BC Managed Care – PPO | Admitting: Anesthesiology

## 2021-03-01 DIAGNOSIS — S43401A Unspecified sprain of right shoulder joint, initial encounter: Secondary | ICD-10-CM | POA: Insufficient documentation

## 2021-03-01 DIAGNOSIS — Z79899 Other long term (current) drug therapy: Secondary | ICD-10-CM | POA: Insufficient documentation

## 2021-03-01 DIAGNOSIS — M25311 Other instability, right shoulder: Secondary | ICD-10-CM | POA: Insufficient documentation

## 2021-03-01 DIAGNOSIS — Y939 Activity, unspecified: Secondary | ICD-10-CM | POA: Insufficient documentation

## 2021-03-01 DIAGNOSIS — M7541 Impingement syndrome of right shoulder: Secondary | ICD-10-CM | POA: Insufficient documentation

## 2021-03-01 DIAGNOSIS — Z86718 Personal history of other venous thrombosis and embolism: Secondary | ICD-10-CM | POA: Diagnosis not present

## 2021-03-01 DIAGNOSIS — Z791 Long term (current) use of non-steroidal anti-inflammatories (NSAID): Secondary | ICD-10-CM | POA: Diagnosis not present

## 2021-03-01 DIAGNOSIS — M19011 Primary osteoarthritis, right shoulder: Secondary | ICD-10-CM | POA: Diagnosis present

## 2021-03-01 HISTORY — DX: Nausea with vomiting, unspecified: R11.2

## 2021-03-01 HISTORY — DX: Other specified postprocedural states: Z98.890

## 2021-03-01 HISTORY — PX: SHOULDER ARTHROSCOPY WITH BANKART REPAIR: SHX5673

## 2021-03-01 SURGERY — SHOULDER ARTHROSCOPY WITH BANKART REPAIR
Anesthesia: Regional | Site: Shoulder | Laterality: Right

## 2021-03-01 MED ORDER — FENTANYL CITRATE (PF) 100 MCG/2ML IJ SOLN: 25.0000 ug | INTRAMUSCULAR | Status: DC | PRN

## 2021-03-01 MED ORDER — FENTANYL CITRATE (PF) 100 MCG/2ML IJ SOLN
INTRAMUSCULAR | Status: DC | PRN
  Administered 2021-03-01: 50 ug via INTRAVENOUS

## 2021-03-01 MED ORDER — ONDANSETRON HCL 4 MG/2ML IJ SOLN
INTRAMUSCULAR | Status: DC | PRN
  Administered 2021-03-01: 4 mg via INTRAVENOUS

## 2021-03-01 MED ORDER — SCOPOLAMINE 1 MG/3DAYS TD PT72
MEDICATED_PATCH | TRANSDERMAL | Status: AC
  Filled 2021-03-01: qty 1

## 2021-03-01 MED ORDER — ROCURONIUM BROMIDE 10 MG/ML (PF) SYRINGE
PREFILLED_SYRINGE | INTRAVENOUS | Status: AC
  Filled 2021-03-01: qty 10

## 2021-03-01 MED ORDER — FENTANYL CITRATE (PF) 100 MCG/2ML IJ SOLN
50.0000 ug | Freq: Once | INTRAMUSCULAR | Status: AC
  Administered 2021-03-01: 50 ug via INTRAVENOUS

## 2021-03-01 MED ORDER — ACETAMINOPHEN 500 MG PO TABS
ORAL_TABLET | ORAL | Status: AC
  Filled 2021-03-01: qty 2

## 2021-03-01 MED ORDER — SUGAMMADEX SODIUM 200 MG/2ML IV SOLN
INTRAVENOUS | Status: DC | PRN
  Administered 2021-03-01: 200 mg via INTRAVENOUS
  Administered 2021-03-01: 170 mg via INTRAVENOUS

## 2021-03-01 MED ORDER — LACTATED RINGERS IV SOLN: INTRAVENOUS | Status: DC

## 2021-03-01 MED ORDER — BUPIVACAINE LIPOSOME 1.3 % IJ SUSP
INTRAMUSCULAR | Status: DC | PRN
  Administered 2021-03-01: 10 mL via PERINEURAL

## 2021-03-01 MED ORDER — DEXAMETHASONE SODIUM PHOSPHATE 10 MG/ML IJ SOLN
INTRAMUSCULAR | Status: AC
  Filled 2021-03-01: qty 1

## 2021-03-01 MED ORDER — LIDOCAINE HCL (PF) 2 % IJ SOLN
INTRAMUSCULAR | Status: AC
  Filled 2021-03-01: qty 5

## 2021-03-01 MED ORDER — OXYCODONE HCL 5 MG PO TABS: ORAL_TABLET | ORAL | 0 refills | Status: AC

## 2021-03-01 MED ORDER — DICLOFENAC SODIUM 75 MG PO TBEC
75.0000 mg | DELAYED_RELEASE_TABLET | Freq: Two times a day (BID) | ORAL | 0 refills | Status: AC
Start: 2021-03-01 — End: ?

## 2021-03-01 MED ORDER — BUPIVACAINE HCL (PF) 0.5 % IJ SOLN
INTRAMUSCULAR | Status: DC | PRN
  Administered 2021-03-01: 15 mL via PERINEURAL

## 2021-03-01 MED ORDER — AMISULPRIDE (ANTIEMETIC) 5 MG/2ML IV SOLN: 10.0000 mg | Freq: Once | INTRAVENOUS | Status: DC | PRN

## 2021-03-01 MED ORDER — SODIUM CHLORIDE 0.9 % IR SOLN
Status: DC | PRN
  Administered 2021-03-01: 6000 mL

## 2021-03-01 MED ORDER — OXYCODONE HCL 5 MG/5ML PO SOLN: 5.0000 mg | Freq: Once | ORAL | Status: DC | PRN

## 2021-03-01 MED ORDER — OXYCODONE HCL 5 MG PO TABS: 5.0000 mg | ORAL_TABLET | Freq: Once | ORAL | Status: DC | PRN

## 2021-03-01 MED ORDER — ONDANSETRON HCL 4 MG PO TABS: 4.0000 mg | ORAL_TABLET | Freq: Three times a day (TID) | ORAL | 0 refills | Status: AC | PRN

## 2021-03-01 MED ORDER — CEFAZOLIN SODIUM-DEXTROSE 2-4 GM/100ML-% IV SOLN
2.0000 g | INTRAVENOUS | Status: AC
  Administered 2021-03-01: 2 g via INTRAVENOUS

## 2021-03-01 MED ORDER — ROCURONIUM BROMIDE 100 MG/10ML IV SOLN
INTRAVENOUS | Status: DC | PRN
  Administered 2021-03-01: 10 mg via INTRAVENOUS
  Administered 2021-03-01: 80 mg via INTRAVENOUS

## 2021-03-01 MED ORDER — LIDOCAINE HCL (PF) 2 % IJ SOLN
INTRAMUSCULAR | Status: DC | PRN
  Administered 2021-03-01: 80 mg via INTRADERMAL

## 2021-03-01 MED ORDER — FENTANYL CITRATE (PF) 100 MCG/2ML IJ SOLN
INTRAMUSCULAR | Status: AC
  Filled 2021-03-01: qty 2

## 2021-03-01 MED ORDER — SCOPOLAMINE 1 MG/3DAYS TD PT72
1.0000 | MEDICATED_PATCH | TRANSDERMAL | Status: DC
  Administered 2021-03-01: 1.5 mg via TRANSDERMAL

## 2021-03-01 MED ORDER — PROPOFOL 10 MG/ML IV BOLUS
INTRAVENOUS | Status: AC
  Filled 2021-03-01: qty 20

## 2021-03-01 MED ORDER — CEFAZOLIN SODIUM-DEXTROSE 2-4 GM/100ML-% IV SOLN
INTRAVENOUS | Status: AC
  Filled 2021-03-01: qty 100

## 2021-03-01 MED ORDER — ACETAMINOPHEN 500 MG PO TABS: 1000.0000 mg | ORAL_TABLET | Freq: Three times a day (TID) | ORAL | 0 refills | Status: AC

## 2021-03-01 MED ORDER — MIDAZOLAM HCL 2 MG/2ML IJ SOLN
INTRAMUSCULAR | Status: AC
  Filled 2021-03-01: qty 2

## 2021-03-01 MED ORDER — ONDANSETRON HCL 4 MG/2ML IJ SOLN
INTRAMUSCULAR | Status: AC
  Filled 2021-03-01: qty 2

## 2021-03-01 MED ORDER — DEXAMETHASONE SODIUM PHOSPHATE 4 MG/ML IJ SOLN
INTRAMUSCULAR | Status: DC | PRN
  Administered 2021-03-01: 10 mg via INTRAVENOUS

## 2021-03-01 MED ORDER — ACETAMINOPHEN 500 MG PO TABS
1000.0000 mg | ORAL_TABLET | Freq: Once | ORAL | Status: AC
  Administered 2021-03-01: 1000 mg via ORAL

## 2021-03-01 MED ORDER — PROMETHAZINE HCL 25 MG/ML IJ SOLN: 6.2500 mg | INTRAMUSCULAR | Status: DC | PRN

## 2021-03-01 MED ORDER — PROPOFOL 10 MG/ML IV BOLUS
INTRAVENOUS | Status: DC | PRN
  Administered 2021-03-01: 200 mg via INTRAVENOUS

## 2021-03-01 MED ORDER — MIDAZOLAM HCL 2 MG/2ML IJ SOLN
2.0000 mg | Freq: Once | INTRAMUSCULAR | Status: AC
  Administered 2021-03-01: 2 mg via INTRAVENOUS

## 2021-03-01 SURGICAL SUPPLY — 57 items
ANCH SUT 2 FBRTK KNTLS 1.8 (Anchor) ×4 IMPLANT
ANCHOR SUT 1.8 FIBERTAK SB KL (Anchor) ×8 IMPLANT
APL PRP STRL LF DISP 70% ISPRP (MISCELLANEOUS) ×1
BLADE EXCALIBUR 4.0X13 (MISCELLANEOUS) ×2 IMPLANT
BNDG COHESIVE 4X5 TAN STRL (GAUZE/BANDAGES/DRESSINGS) IMPLANT
BUR SURG 4D 13L RD FLUTE (BUR) IMPLANT
BURR OVAL 8 FLU 4.0X13 (MISCELLANEOUS) ×2 IMPLANT
BURR SURG 4D 13L RD FLUTE (BUR)
CANNULA 5.75X71 LONG (CANNULA) ×2 IMPLANT
CANNULA TWIST IN 8.25X7CM (CANNULA) ×4 IMPLANT
CHLORAPREP W/TINT 26 (MISCELLANEOUS) ×2 IMPLANT
CLSR STERI-STRIP ANTIMIC 1/2X4 (GAUZE/BANDAGES/DRESSINGS) ×2 IMPLANT
COOLER ICEMAN CLASSIC (MISCELLANEOUS) ×2 IMPLANT
DECANTER SPIKE VIAL GLASS SM (MISCELLANEOUS) IMPLANT
DISSECTOR 3.5MM X 13CM CVD (MISCELLANEOUS) IMPLANT
DISSECTOR 4.0MMX13CM CVD (MISCELLANEOUS) IMPLANT
DRAPE IMP U-DRAPE 54X76 (DRAPES) ×2 IMPLANT
DRAPE INCISE IOBAN 66X45 STRL (DRAPES) IMPLANT
DRAPE SHOULDER BEACH CHAIR (DRAPES) ×2 IMPLANT
DRSG PAD ABDOMINAL 8X10 ST (GAUZE/BANDAGES/DRESSINGS) ×2 IMPLANT
DW OUTFLOW CASSETTE/TUBE SET (MISCELLANEOUS) ×2 IMPLANT
GAUZE SPONGE 4X4 12PLY STRL (GAUZE/BANDAGES/DRESSINGS) ×2 IMPLANT
GLOVE SRG 8 PF TXTR STRL LF DI (GLOVE) ×1 IMPLANT
GLOVE SURG ENC MOIS LTX SZ6.5 (GLOVE) ×2 IMPLANT
GLOVE SURG LTX SZ8 (GLOVE) ×2 IMPLANT
GLOVE SURG UNDER POLY LF SZ6.5 (GLOVE) ×2 IMPLANT
GLOVE SURG UNDER POLY LF SZ8 (GLOVE) ×2
GOWN STRL REUS W/ TWL LRG LVL3 (GOWN DISPOSABLE) ×2 IMPLANT
GOWN STRL REUS W/TWL LRG LVL3 (GOWN DISPOSABLE) ×4
GOWN STRL REUS W/TWL XL LVL3 (GOWN DISPOSABLE) ×2 IMPLANT
KIT STR SPEAR 1.8 FBRTK DISP (KITS) ×2 IMPLANT
LASSO 90 CVE QUICKPAS (DISPOSABLE) IMPLANT
LASSO CRESCENT QUICKPASS (SUTURE) IMPLANT
MANIFOLD NEPTUNE II (INSTRUMENTS) ×2 IMPLANT
NDL SAFETY ECLIPSE 18X1.5 (NEEDLE) ×1 IMPLANT
NEEDLE HYPO 18GX1.5 SHARP (NEEDLE) ×2
PACK ARTHROSCOPY DSU (CUSTOM PROCEDURE TRAY) ×2 IMPLANT
PACK BASIN DAY SURGERY FS (CUSTOM PROCEDURE TRAY) ×2 IMPLANT
PAD COLD SHLDR WRAP-ON (PAD) ×2 IMPLANT
PAD ORTHO SHOULDER 7X19 LRG (SOFTGOODS) IMPLANT
PORT APPOLLO RF 90DEGREE MULTI (SURGICAL WAND) ×2 IMPLANT
SHEET MEDIUM DRAPE 40X70 STRL (DRAPES) ×2 IMPLANT
SLEEVE ARM SUSPENSION SYSTEM (MISCELLANEOUS) IMPLANT
SLEEVE SCD COMPRESS KNEE MED (STOCKING) ×2 IMPLANT
SLING ARM FOAM STRAP LRG (SOFTGOODS) IMPLANT
SLING S3 LATERAL DISP (MISCELLANEOUS) IMPLANT
SUT FIBERWIRE #2 38 T-5 BLUE (SUTURE)
SUT MNCRL AB 4-0 PS2 18 (SUTURE) ×2 IMPLANT
SUT TIGER TAPE 7 IN WHITE (SUTURE) IMPLANT
SUTURE FIBERWR #2 38 T-5 BLUE (SUTURE) IMPLANT
SUTURE TAPE TIGERLINK 1.3MM BL (SUTURE) IMPLANT
SUTURETAPE TIGERLINK 1.3MM BL (SUTURE)
SYR 5ML LL (SYRINGE) ×2 IMPLANT
TAPE FIBER 2MM 7IN #2 BLUE (SUTURE) IMPLANT
TOWEL GREEN STERILE FF (TOWEL DISPOSABLE) ×2 IMPLANT
TUBE CONNECTING 20X1/4 (TUBING) ×2 IMPLANT
TUBING ARTHROSCOPY IRRIG 16FT (MISCELLANEOUS) ×2 IMPLANT

## 2021-03-01 NOTE — Anesthesia Procedure Notes (Signed)
Procedure Name: Intubation Date/Time: 03/01/2021 9:02 AM Performed by: Ezequiel Kayser, CRNA Pre-anesthesia Checklist: Patient identified, Emergency Drugs available, Suction available and Patient being monitored Patient Re-evaluated:Patient Re-evaluated prior to induction Oxygen Delivery Method: Circle System Utilized Preoxygenation: Pre-oxygenation with 100% oxygen Induction Type: IV induction Ventilation: Mask ventilation without difficulty Laryngoscope Size: Mac and 4 Grade View: Grade I Tube type: Oral Tube size: 7.0 mm Number of attempts: 1 Airway Equipment and Method: Stylet and Oral airway Placement Confirmation: ETT inserted through vocal cords under direct vision, positive ETCO2 and breath sounds checked- equal and bilateral Secured at: 22 cm Tube secured with: Tape Dental Injury: Teeth and Oropharynx as per pre-operative assessment

## 2021-03-01 NOTE — Anesthesia Preprocedure Evaluation (Addendum)
Anesthesia Evaluation  Patient identified by MRN, date of birth, ID band Patient awake    Reviewed: Allergy & Precautions, NPO status , Patient's Chart, lab work & pertinent test results  History of Anesthesia Complications (+) PONV and history of anesthetic complications  Airway Mallampati: I  TM Distance: >3 FB Neck ROM: Full    Dental no notable dental hx.    Pulmonary neg pulmonary ROS,    Pulmonary exam normal breath sounds clear to auscultation       Cardiovascular negative cardio ROS Normal cardiovascular exam Rhythm:Regular Rate:Normal     Neuro/Psych negative neurological ROS  negative psych ROS   GI/Hepatic negative GI ROS, Neg liver ROS,   Endo/Other  negative endocrine ROS  Renal/GU negative Renal ROS  negative genitourinary   Musculoskeletal negative musculoskeletal ROS (+)   Abdominal   Peds negative pediatric ROS (+)  Hematology negative hematology ROS (+)   Anesthesia Other Findings   Reproductive/Obstetrics negative OB ROS                            Anesthesia Physical Anesthesia Plan  ASA: 1  Anesthesia Plan: General and Regional   Post-op Pain Management: GA combined w/ Regional for post-op pain   Induction: Intravenous  PONV Risk Score and Plan: 3 and Treatment may vary due to age or medical condition, Midazolam, Ondansetron, Dexamethasone and Scopolamine patch - Pre-op  Airway Management Planned: Oral ETT  Additional Equipment:   Intra-op Plan:   Post-operative Plan: Extubation in OR  Informed Consent: I have reviewed the patients History and Physical, chart, labs and discussed the procedure including the risks, benefits and alternatives for the proposed anesthesia with the patient or authorized representative who has indicated his/her understanding and acceptance.     Dental advisory given  Plan Discussed with: CRNA  Anesthesia Plan Comments:  (Interscalene block with exparel. GETA. )       Anesthesia Quick Evaluation

## 2021-03-01 NOTE — Op Note (Signed)
Orthopaedic Surgery Operative Note (CSN: [id number])  Rodney Vasquez  21-May-1981 Date of Surgery: 03/01/2021   Diagnoses:  Right shoulder instability, posterior labral tear, impingement and AC arthritis  Procedure: Arthroscopic extensive debridement Arthroscopic subacromial decompression Arthroscopic distal clavicle excision Arthroscopic labral repair and capsulorrhaphy   Operative Finding Exam under anesthesia: Patient had clear posterior instability with posterior directed force, but no obvious anterior instability. Articular space: Labral tear from 5:00 to 9:00, somewhat degenerative in nature.  There is an area in the posterior aspect of the shoulder that initially looked like a HAGL lesion but in fact seem to be an anatomic variant after thorough examination. Chondral surfaces: Humeral head was intact but there is some early mild degenerative changes in the posterior aspect of the glenoid. Biceps: Normal Subscapularis: Normal Superior Cuff: Normal Bursal side: Normal cuff, type II acromion converted to type I and distal clavicle excision performed.  Successful completion of the planned procedure.  The patient may have issues with stiffness due to early glenoid changes.  He had a CC and AC ligament injury as well during his injury.  We worry about AC instability though we do not think this is likely.  We have discussed with the patient for surgery.   Post-operative plan: The patient will be non-weightbearing in a sling external rotation for 6 weeks with therapy to start early.  The patient will be discharged home.  DVT prophylaxis not indicated in ambulatory upper extremity patient without known risk factors.   Pain control with PRN pain medication preferring oral medicines.  Follow up plan will be scheduled in approximately 7 days for incision check and XR.  Post-Op Diagnosis: Same Surgeons:Primary: Bjorn Pippin, MD Assistants:Caroline McBane PA-C Location: [facility] OR ROOM  6 Anesthesia: General with Exparel interscalene block Antibiotics: Ancef 2 g Tourniquet time: None Estimated Blood Loss: Minimal Complications: None Specimens: None Implants: Implant Name Type Inv. Item Serial No. Manufacturer Lot No. LRB No. Used Action  1.8 knotless fibertak soft   [id number]  [id number] Right 4 Implanted    Indications for Surgery:   Rodney Vasquez is a 40 y.o. male with continued shoulder pain refractory to nonoperative measures for extended period of time.  He had continued instability type symptoms and an AC injury/CC injury though that seem to be healing well.  He had no elevation of the distal clavicle but he had pain here.  The risks and benefits were explained at length including but not limited to continued pain, cuff failure, biceps tenodesis failure, stiffness, need for further surgery and infection.   Procedure:   Patient was correctly identified in the preoperative holding area and operative site marked.  Patient brought to OR and positioned beachchair on an Briar table ensuring that all bony prominences were padded and the head was in an appropriate location.  Anesthesia was induced and the operative shoulder was prepped and draped in the usual sterile fashion.  Timeout was called preincision.  A standard posterior viewing portal was made after localizing the portal with a spinal needle.  An anterior accessory portal was also made.  After clearing the articular space the camera was positioned in the subacromial space.  Findings above.    Extensive debridement was performed of the anterior interval tissue, labral fraying and the bursa.  Labral repair.  Patient had a tear from 5:00 to 9:00 with the posterior aspect being the more predominant finding.  We used an elevator as well as a rasp to stimulate the bone and  tissue healing.  We mobilized the labrum extensively.  At that point we used for 1.8 mm knotless fiber tack anchors to secure the labrum.  We placed these  in the above clock face locations.  We used a suture lasso to pass the sutures and robust fixation of the tissue as well as the labrum.  A capsulorrhaphy was centrally performed as well.  Centered head at the end of the case.  Subacromial decompression: We made a lateral portal with spinal needle guidance. We then proceeded to debride bursal tissue extensively with a shaver and arthrocare device. At that point we continued to identify the borders of the acromion and identify the spur. We then carefully preserved the deltoid fascia and used a burr to convert the acromion to a Type 1 flat acromion without issue.  Distal Clavicle resection:  The scope was placed in the subacromial space from the posterior portal.  A hemostat was placed through the anterior portal and we spread at the [facility] joint.  A burr was then inserted and 10 mm of distal clavicle was resected taking care to avoid damage to the capsule around the joint and avoiding overhanging bone posteriorly.     The incisions were closed with absorbable monocryl and steri strips.  A sterile dressing was placed along with a sling. The patient was awoken from general anesthesia and taken to the PACU in stable condition without complication.   Alfonse Alpers, PA-C, present and scrubbed throughout the case, critical for completion in a timely fashion, and for retraction, instrumentation, closure.

## 2021-03-01 NOTE — Transfer of Care (Signed)
Immediate Anesthesia Transfer of Care Note  Patient: Rodney Vasquez  Procedure(s) Performed: SHOULDER ARTHROSCOPY WITH BANKART REPAIR (Right: Shoulder) SHOULDER ARTHROSCOPY WITH SUBACROMIAL DECOMPRESSION AND DISTAL CLAVICLE EXCISION (Right: Shoulder)  Patient Location: PACU  Anesthesia Type:General and Regional  Level of Consciousness: drowsy  Airway & Oxygen Therapy: Patient Spontanous Breathing and Patient connected to face mask oxygen  Post-op Assessment: Report given to RN and Post -op Vital signs reviewed and stable  Post vital signs: Reviewed and stable  Last Vitals:  Vitals Value Taken Time  BP 120/97   Temp    Pulse 80   Resp    SpO2 97     Last Pain:  Vitals:   03/01/21 0817  TempSrc: Oral  PainSc: 0-No pain         Complications: No notable events documented.

## 2021-03-01 NOTE — Discharge Instructions (Addendum)
Ramond Marrow MD, MPH Rodney Alpers, PA-C [facility] [address] [phone] (tel)   [phone] (fax)   POST-OPERATIVE INSTRUCTIONS - SHOULDER ARTHROSCOPY  WOUND CARE You may remove the Operative Dressing on Post-Op Day #3 (72hrs after surgery).   Alternatively if you would like you can leave dressing on until follow-up if within 7-8 days but keep it dry. Leave steri-strips in place until they fall off on their own, usually 2 weeks postop. There may be a small amount of fluid/bleeding leaking at the surgical site.  This is normal; the shoulder is filled with fluid during the procedure and can leak for 24-48hrs after surgery.  You may change/reinforce the bandage as needed.  Use the Cryocuff or Ice as often as possible for the first 7 days, then as needed for pain relief. Always keep a towel, ACE wrap or other barrier between the cooling unit and your skin.  You may shower on Post-Op Day #3. Gently pat the area dry. Do not soak the shoulder in water or submerge it. Keep incisions as dry as possible. Do not go swimming in the pool or ocean until 4 weeks after surgery or when otherwise instructed.    EXERCISES/BRACING Sling should be used at all times until follow-up.  You can remove sling for hygiene.    Please continue to ambulate and do not stay sitting or lying for too long. Perform foot and wrist pumps to assist in circulation.  POST-OP MEDICATIONS- Multimodal approach to pain control In general your pain will be controlled with a combination of substances.  Prescriptions unless otherwise discussed are electronically sent to your pharmacy.  This is a carefully made plan we use to minimize narcotic use.     Diclofenac - Anti-inflammatory medication taken on a scheduled basis Acetaminophen - Non-narcotic pain medicine taken on a scheduled basis  Oxycodone - This is a strong narcotic, to be used only on an "as needed" basis for SEVERE pain. Zofran  - take as needed for nausea   FOLLOW-UP If you develop a Fever (?101.5), Redness or Drainage from the surgical incision site, please call our office to arrange for an evaluation. Please call the office to schedule a follow-up appointment for your suture removal, 10-14 days post-operatively.    HELPFUL INFORMATION  If you had a block, it will wear off between 8-24 hrs postop typically.  This is period when your pain may go from nearly zero to the pain you would have had postop without the block.  This is an abrupt transition but nothing dangerous is happening.  You may take an extra dose of narcotic when this happens.  You may be more comfortable sleeping in a semi-seated position the first few nights following surgery.  Keep a pillow propped under the elbow and forearm for comfort.  If you have a recliner type of chair it might be beneficial.  If not that is fine too, but it would be helpful to sleep propped up with pillows behind your operated shoulder as well under your elbow and forearm.  This will reduce pulling on the suture lines.  When dressing, put your operative arm in the sleeve first.  When getting undressed, take your operative arm out last.  Loose fitting, button-down shirts are recommended.  Often in the first days after surgery you may be more comfortable keeping your operative arm under your shirt and not through the sleeve.  You may return to work/school in the next couple of  days when you feel up to it.  Desk work and typing in the sling is fine.  We suggest you use the pain medication the first night prior to going to bed, in order to ease any pain when the anesthesia wears off. You should avoid taking pain medications on an empty stomach as it will make you nauseous.  You should wean off your narcotic medicines as soon as you are able.  Most patients will be off or using minimal narcotics before their first postop appointment.   Do not drink alcoholic beverages or take  illicit drugs when taking pain medications.  It is against the law to drive while taking narcotics.  In some states it is against the law to drive while your arm is in a sling.   Pain medication may make you constipated.  Below are a few solutions to try in this order: Decrease the amount of pain medication if you aren't having pain. Drink lots of decaffeinated fluids. Drink prune juice and/or eat dried prunes  If the first 3 don't work start with additional solutions Take Colace - an over-the-counter stool softener Take Senokot - an over-the-counter laxative Take Miralax - a stronger over-the-counter laxative  For more information including helpful videos and documents visit our website:   [email or web address]   No tylenol until after 2:30 pm today.  Post Anesthesia Home Care Instructions  Activity: Get plenty of rest for the remainder of the day. A responsible individual must stay with you for 24 hours following the procedure.  For the next 24 hours, DO NOT: -Drive a car -Advertising copywriter -Drink alcoholic beverages -Take any medication unless instructed by your physician -Make any legal decisions or sign important papers.  Meals: Start with liquid foods such as gelatin or soup. Progress to regular foods as tolerated. Avoid greasy, spicy, heavy foods. If nausea and/or vomiting occur, drink only clear liquids until the nausea and/or vomiting subsides. Call your physician if vomiting continues.  Special Instructions/Symptoms: Your throat may feel dry or sore from the anesthesia or the breathing tube placed in your throat during surgery. If this causes discomfort, gargle with warm salt water. The discomfort should disappear within 24 hours.  If you had a scopolamine patch placed behind your ear for the management of post- operative nausea and/or vomiting:  1. The medication in the patch is effective for 72 hours, after which it should be removed.   Wrap patch in a tissue and discard in the trash. Wash hands thoroughly with soap and water. 2. You may remove the patch earlier than 72 hours if you experience unpleasant side effects which may include dry mouth, dizziness or visual disturbances. 3. Avoid touching the patch. Wash your hands with soap and water after contact with the patch.    Regional Anesthesia Blocks  1. Numbness or the inability to move the "blocked" extremity may last from 3-48 hours after placement. The length of time depends on the medication injected and your individual response to the medication. If the numbness is not going away after 48 hours, call your surgeon.  2. The extremity that is blocked will need to be protected until the numbness is gone and the  Strength has returned. Because you cannot feel it, you will need to take extra care to avoid injury. Because it may be weak, you may have difficulty moving it or using it. You may not know what position it is in without looking at it while the block is in effect.  3. For blocks in the legs and feet, returning to weight bearing and walking needs to be done carefully. You will need to wait until the numbness is entirely gone and the strength has returned. You should be able to move your leg and foot normally before you try and bear weight or walk. You will need someone to be with you when you first try to ensure you do not fall and possibly risk injury.  4. Bruising and tenderness at the needle site are common side effects and will resolve in a few days.  5. Persistent numbness or new problems with movement should be communicated to the surgeon or the [facility] [phone] [facility] [phone]). Information for Discharge Teaching: EXPAREL (bupivacaine liposome injectable suspension)   Your surgeon or anesthesiologist gave you EXPAREL(bupivacaine) to help control your pain after surgery.  EXPAREL is a local anesthetic that provides pain  relief by numbing the tissue around the surgical site. EXPAREL is designed to release pain medication over time and can control pain for up to 72 hours. Depending on how you respond to EXPAREL, you may require less pain medication during your recovery.  Possible side effects: Temporary loss of sensation or ability to move in the area where bupivacaine was injected. Nausea, vomiting, constipation Rarely, numbness and tingling in your mouth or lips, lightheadedness, or anxiety may occur. Call your doctor right away if you think you may be experiencing any of these sensations, or if you have other questions regarding possible side effects.  Follow all other discharge instructions given to you by your surgeon or nurse. Eat a healthy diet and drink plenty of water or other fluids.  If you return to the hospital for any reason within 96 hours following the administration of EXPAREL, it is important for health care providers to know that you have received this anesthetic. A teal colored band has been placed on your arm with the date, time and amount of EXPAREL you have received in order to alert and inform your health care providers. Please leave this armband in place for the full 96 hours following administration, and then you may remove the band.

## 2021-03-01 NOTE — Anesthesia Procedure Notes (Signed)
Anesthesia Regional Block: Interscalene brachial plexus block   Pre-Anesthetic Checklist: , timeout performed,  Correct Patient, Correct Site, Correct Laterality,  Correct Procedure, Correct Position, site marked,  Risks and benefits discussed,  Surgical consent,  Pre-op evaluation,  At surgeon's request and post-op pain management  Laterality: Right  Prep: chloraprep       Needles:  Injection technique: Single-shot  Needle Type: Echogenic Stimulator Needle     Needle Length: 10cm  Needle Gauge: 20     Additional Needles:   Procedures:,,,, ultrasound used (permanent image in chart),,    Narrative:  Start time: 03/01/2021 8:35 AM End time: 03/01/2021 8:40 AM Injection made incrementally with aspirations every 5 mL.  Performed by: Personally  Anesthesiologist: Mellody Dance, MD  Additional Notes: Functioning IV was confirmed and monitors applied. Sterile prep and drape,hand hygiene and sterile gloves were used.Ultrasound guidance: relevant anatomy identified, needle position confirmed, local anesthetic spread visualized around nerve(s)., vascular puncture avoided.  Image printed for medical record.  Negative aspiration and negative test dose prior to incremental administration of local anesthetic. The patient tolerated the procedure well.

## 2021-03-01 NOTE — Interval H&P Note (Signed)
History and Physical Interval Note:  03/01/2021 8:06 AM  Rodney Vasquez  has presented today for surgery, with the diagnosis of right shoulder cartlidge disorder,osteoarthritis,impingement syndrome, bankart tear.  The various methods of treatment have been discussed with the patient and family. After consideration of risks, benefits and other options for treatment, the patient has consented to  Procedure(s): SHOULDER ARTHROSCOPY WITH BANKART REPAIR (Right) as a surgical intervention.  The patient's history has been reviewed, patient examined, no change in status, stable for surgery.  I have reviewed the patient's chart and labs.  Questions were answered to the patient's satisfaction.     Bjorn Pippin

## 2021-03-01 NOTE — Anesthesia Postprocedure Evaluation (Signed)
Anesthesia Post Note  Patient: Rodney Vasquez  Procedure(s) Performed: SHOULDER ARTHROSCOPY WITH BANKART REPAIR (Right: Shoulder) SHOULDER ARTHROSCOPY WITH SUBACROMIAL DECOMPRESSION AND DISTAL CLAVICLE EXCISION (Right: Shoulder)     Patient location during evaluation: PACU Anesthesia Type: Regional and General Level of consciousness: awake and alert Pain management: pain level controlled Vital Signs Assessment: post-procedure vital signs reviewed and stable Respiratory status: spontaneous breathing, nonlabored ventilation and respiratory function stable Cardiovascular status: blood pressure returned to baseline and stable Postop Assessment: no apparent nausea or vomiting Anesthetic complications: no   No notable events documented.  Last Vitals:  Vitals:   03/01/21 1045 03/01/21 1106  BP: (!) 124/92 (!) 123/92  Pulse: 79 78  Resp: 16 14  Temp:  36.5 C  SpO2: 97% 96%    Last Pain:  Vitals:   03/01/21 1106  TempSrc:   PainSc: 0-No pain                 Mellody Dance

## 2021-03-01 NOTE — Progress Notes (Signed)
AssistedDr. Bass with right, ultrasound guided, interscalene  block. Side rails up, monitors on throughout procedure. See vital signs in flow sheet. Tolerated Procedure well.  

## 2021-03-01 NOTE — Hospital Course (Signed)
[facility] pt

## 2021-03-05 ENCOUNTER — Encounter (HOSPITAL_BASED_OUTPATIENT_CLINIC_OR_DEPARTMENT_OTHER): Payer: Self-pay | Admitting: Orthopaedic Surgery

## 2021-07-26 ENCOUNTER — Emergency Department (HOSPITAL_BASED_OUTPATIENT_CLINIC_OR_DEPARTMENT_OTHER)
Admission: EM | Admit: 2021-07-26 | Discharge: 2021-07-26 | Disposition: A | Discharge status: Home / Self Care | Payer: BC Managed Care – PPO | Attending: Emergency Medicine | Admitting: Emergency Medicine

## 2021-07-26 ENCOUNTER — Encounter (HOSPITAL_BASED_OUTPATIENT_CLINIC_OR_DEPARTMENT_OTHER): Payer: Self-pay

## 2021-07-26 ENCOUNTER — Emergency Department (HOSPITAL_BASED_OUTPATIENT_CLINIC_OR_DEPARTMENT_OTHER): Payer: BC Managed Care – PPO | Admitting: Radiology

## 2021-07-26 ENCOUNTER — Other Ambulatory Visit: Payer: Self-pay

## 2021-07-26 DIAGNOSIS — S61211A Laceration without foreign body of left index finger without damage to nail, initial encounter: Secondary | ICD-10-CM | POA: Insufficient documentation

## 2021-07-26 DIAGNOSIS — W268XXA Contact with other sharp object(s), not elsewhere classified, initial encounter: Secondary | ICD-10-CM | POA: Diagnosis not present

## 2021-07-26 DIAGNOSIS — S6991XA Unspecified injury of right wrist, hand and finger(s), initial encounter: Secondary | ICD-10-CM | POA: Diagnosis present

## 2021-07-26 MED ORDER — LIDOCAINE HCL (PF) 1 % IJ SOLN
10.0000 mL | Freq: Once | INTRAMUSCULAR | Status: AC
  Administered 2021-07-26: 10 mL
  Filled 2021-07-26: qty 10

## 2021-07-26 MED ORDER — CEPHALEXIN 500 MG PO CAPS: 500.0000 mg | ORAL_CAPSULE | Freq: Three times a day (TID) | ORAL | 0 refills | Status: AC

## 2021-07-26 MED ORDER — TETANUS-DIPHTH-ACELL PERTUSSIS 5-2.5-18.5 LF-MCG/0.5 IM SUSY
0.5000 mL | PREFILLED_SYRINGE | Freq: Once | INTRAMUSCULAR | Status: DC
  Filled 2021-07-26: qty 0.5

## 2021-07-26 NOTE — ED Notes (Signed)
Pt d/c home per MD order. Discharge summary reviewed with pt, pt verbalizes understanding, ambulatory off unit. No s/s of acute distress noted at discharge.

## 2021-07-26 NOTE — Discharge Instructions (Signed)
Laceration was repaired with 4 stitches.  I have sent in antibiotics to your pharmacy.  If you develop worsening redness, limitation in your range of motion, fever, drainage from the area please return to the emergency room.  Otherwise the stitches need to be removed in about 7 to 10 days.  You can have this done at your primary care office, urgent care or return to this emergency room.

## 2021-07-26 NOTE — ED Triage Notes (Signed)
Pt presents with a lac to his Left index finger. States he sliced it on a piece of sheet metal
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
